# Patient Record
Sex: Female | Born: 1965 | Race: Black or African American | Hispanic: No | Marital: Single
Health system: Southern US, Community
[De-identification: ages and names within clinical notes are randomized; demographics above are authoritative.]

## PROBLEM LIST (undated history)

## (undated) HISTORY — PX: BREAST BIOPSY: SHX20

## (undated) HISTORY — PX: BREAST EXCISIONAL BIOPSY: SUR124

---

## 2015-09-25 ENCOUNTER — Other Ambulatory Visit: Payer: Self-pay | Admitting: Family

## 2015-09-25 ENCOUNTER — Ambulatory Visit
Admission: RE | Admit: 2015-09-25 | Discharge: 2015-09-25 | Disposition: A | Discharge status: Home / Self Care | Payer: Self-pay | Source: Ambulatory Visit | Attending: Family | Admitting: Family

## 2015-09-25 DIAGNOSIS — R0781 Pleurodynia: Secondary | ICD-10-CM

## 2016-06-08 ENCOUNTER — Other Ambulatory Visit: Payer: Self-pay | Admitting: Obstetrics and Gynecology

## 2016-06-08 ENCOUNTER — Other Ambulatory Visit (HOSPITAL_COMMUNITY)
Admission: RE | Admit: 2016-06-08 | Discharge: 2016-06-08 | Disposition: A | Discharge status: Home / Self Care | Payer: 59 | Source: Ambulatory Visit | Attending: Obstetrics and Gynecology | Admitting: Obstetrics and Gynecology

## 2016-06-08 DIAGNOSIS — Z1231 Encounter for screening mammogram for malignant neoplasm of breast: Secondary | ICD-10-CM

## 2016-06-08 DIAGNOSIS — Z1151 Encounter for screening for human papillomavirus (HPV): Secondary | ICD-10-CM | POA: Diagnosis not present

## 2016-06-08 DIAGNOSIS — Z01419 Encounter for gynecological examination (general) (routine) without abnormal findings: Secondary | ICD-10-CM | POA: Diagnosis present

## 2016-06-10 LAB — CYTOLOGY - PAP

## 2016-06-23 ENCOUNTER — Ambulatory Visit
Admission: RE | Admit: 2016-06-23 | Discharge: 2016-06-23 | Disposition: A | Discharge status: Home / Self Care | Payer: 59 | Source: Ambulatory Visit | Attending: Obstetrics and Gynecology | Admitting: Obstetrics and Gynecology

## 2016-06-23 DIAGNOSIS — Z1231 Encounter for screening mammogram for malignant neoplasm of breast: Secondary | ICD-10-CM

## 2016-06-24 ENCOUNTER — Other Ambulatory Visit: Payer: Self-pay | Admitting: Obstetrics and Gynecology

## 2016-06-24 DIAGNOSIS — R928 Other abnormal and inconclusive findings on diagnostic imaging of breast: Secondary | ICD-10-CM

## 2016-06-28 ENCOUNTER — Ambulatory Visit
Admission: RE | Admit: 2016-06-28 | Discharge: 2016-06-28 | Disposition: A | Discharge status: Home / Self Care | Payer: 59 | Source: Ambulatory Visit | Attending: Obstetrics and Gynecology | Admitting: Obstetrics and Gynecology

## 2016-06-28 DIAGNOSIS — R928 Other abnormal and inconclusive findings on diagnostic imaging of breast: Secondary | ICD-10-CM

## 2018-07-13 ENCOUNTER — Other Ambulatory Visit: Payer: Self-pay | Admitting: Obstetrics and Gynecology

## 2018-07-13 ENCOUNTER — Ambulatory Visit
Admission: RE | Admit: 2018-07-13 | Discharge: 2018-07-13 | Disposition: A | Discharge status: Home / Self Care | Payer: BC Managed Care – PPO | Source: Ambulatory Visit | Attending: Obstetrics and Gynecology | Admitting: Obstetrics and Gynecology

## 2018-07-13 DIAGNOSIS — Z1231 Encounter for screening mammogram for malignant neoplasm of breast: Secondary | ICD-10-CM

## 2018-11-18 ENCOUNTER — Emergency Department (HOSPITAL_COMMUNITY)
Admission: EM | Admit: 2018-11-18 | Discharge: 2018-11-18 | Disposition: A | Discharge status: Home / Self Care | Payer: BC Managed Care – PPO | Attending: Emergency Medicine | Admitting: Emergency Medicine

## 2018-11-18 ENCOUNTER — Other Ambulatory Visit: Payer: Self-pay

## 2018-11-18 ENCOUNTER — Encounter (HOSPITAL_COMMUNITY): Payer: Self-pay

## 2018-11-18 DIAGNOSIS — R4182 Altered mental status, unspecified: Secondary | ICD-10-CM | POA: Diagnosis present

## 2018-11-18 DIAGNOSIS — Z79899 Other long term (current) drug therapy: Secondary | ICD-10-CM | POA: Diagnosis not present

## 2018-11-18 DIAGNOSIS — F12921 Cannabis use, unspecified with intoxication delirium: Secondary | ICD-10-CM

## 2018-11-18 DIAGNOSIS — E876 Hypokalemia: Secondary | ICD-10-CM

## 2018-11-18 DIAGNOSIS — F1994 Other psychoactive substance use, unspecified with psychoactive substance-induced mood disorder: Secondary | ICD-10-CM | POA: Insufficient documentation

## 2018-11-18 LAB — CBC WITH DIFFERENTIAL/PLATELET
Abs Immature Granulocytes: 0.02 10*3/uL (ref 0.00–0.07)
BASOS PCT: 1 %
Basophils Absolute: 0.1 10*3/uL (ref 0.0–0.1)
EOS ABS: 0.1 10*3/uL (ref 0.0–0.5)
EOS PCT: 1 %
HCT: 35.1 % — ABNORMAL LOW (ref 36.0–46.0)
Hemoglobin: 11.6 g/dL — ABNORMAL LOW (ref 12.0–15.0)
IMMATURE GRANULOCYTES: 0 %
Lymphocytes Relative: 47 %
Lymphs Abs: 2.9 10*3/uL (ref 0.7–4.0)
MCH: 28.9 pg (ref 26.0–34.0)
MCHC: 33 g/dL (ref 30.0–36.0)
MCV: 87.3 fL (ref 80.0–100.0)
Monocytes Absolute: 0.4 10*3/uL (ref 0.1–1.0)
Monocytes Relative: 7 %
NEUTROS PCT: 44 %
NRBC: 0 % (ref 0.0–0.2)
Neutro Abs: 2.7 10*3/uL (ref 1.7–7.7)
PLATELETS: 378 10*3/uL (ref 150–400)
RBC: 4.02 MIL/uL (ref 3.87–5.11)
RDW: 12 % (ref 11.5–15.5)
WBC: 6.2 10*3/uL (ref 4.0–10.5)

## 2018-11-18 LAB — COMPREHENSIVE METABOLIC PANEL
ALT: 15 U/L (ref 0–44)
AST: 24 U/L (ref 15–41)
Albumin: 3.8 g/dL (ref 3.5–5.0)
Alkaline Phosphatase: 34 U/L — ABNORMAL LOW (ref 38–126)
Anion gap: 12 (ref 5–15)
BILIRUBIN TOTAL: 0.7 mg/dL (ref 0.3–1.2)
BUN: 13 mg/dL (ref 6–20)
CO2: 23 mmol/L (ref 22–32)
CREATININE: 1.02 mg/dL — AB (ref 0.44–1.00)
Calcium: 9.2 mg/dL (ref 8.9–10.3)
Chloride: 102 mmol/L (ref 98–111)
Glucose, Bld: 131 mg/dL — ABNORMAL HIGH (ref 70–99)
POTASSIUM: 3.3 mmol/L — AB (ref 3.5–5.1)
Sodium: 137 mmol/L (ref 135–145)
TOTAL PROTEIN: 6.8 g/dL (ref 6.5–8.1)

## 2018-11-18 LAB — RAPID URINE DRUG SCREEN, HOSP PERFORMED
AMPHETAMINES: NOT DETECTED
Amphetamines: NOT DETECTED
BARBITURATES: NOT DETECTED
BENZODIAZEPINES: NOT DETECTED
Barbiturates: NOT DETECTED
Benzodiazepines: NOT DETECTED
COCAINE: NOT DETECTED
Cocaine: NOT DETECTED
Opiates: NOT DETECTED
Opiates: NOT DETECTED
Tetrahydrocannabinol: POSITIVE — AB
Tetrahydrocannabinol: POSITIVE — AB

## 2018-11-18 LAB — CBG MONITORING, ED: Glucose-Capillary: 129 mg/dL — ABNORMAL HIGH (ref 70–99)

## 2018-11-18 LAB — ACETAMINOPHEN LEVEL: Acetaminophen (Tylenol), Serum: <10 ug/mL — ABNORMAL LOW (ref 10–30)

## 2018-11-18 LAB — POC URINE PREG, ED: Preg Test, Ur: NEGATIVE

## 2018-11-18 LAB — SALICYLATE LEVEL

## 2018-11-18 LAB — ETHANOL: Alcohol, Ethyl (B): <10 mg/dL (ref <10)

## 2018-11-18 MED ORDER — LORAZEPAM 2 MG/ML IJ SOLN
INTRAMUSCULAR | Status: AC
  Administered 2018-11-18: 1 mg via INTRAMUSCULAR
  Filled 2018-11-18: qty 1

## 2018-11-18 MED ORDER — LORAZEPAM 2 MG/ML IJ SOLN
1.0000 mg | Freq: Once | INTRAMUSCULAR | Status: AC
  Administered 2018-11-18: 1 mg via INTRAMUSCULAR

## 2018-11-18 NOTE — ED Notes (Signed)
Keys, cell phone, and insurance card at bedside with patient.

## 2018-11-18 NOTE — ED Notes (Signed)
TTS at bedside. 

## 2018-11-18 NOTE — Discharge Instructions (Addendum)
It was our pleasure to provide your ER care today - we hope that you feel better.  From today's lab tests, your potassium level is slightly low - eat plenty of fruits and vegetables, and follow up with primary care doctor.   Return to ER if worse, symptoms recur or new symptoms, fevers, trouble breathing, other concern.

## 2018-11-18 NOTE — ED Provider Notes (Signed)
Patient is alert, oriented, with normal mood/affect. No delusions, hallucinations, or psychosis. Pt is feeling back to normal, and feels ready for d/c. Pt denies any c/o. Vitals normal.   Pt currently appears stable for d/c.   Vitals:   11/18/18 0630 11/18/18 0700  BP: (!) 125/91 123/82  Pulse: 68   Resp: 15 15  Temp:    SpO2: 99% 99%      Cathren Laine, MD 11/18/18 [phone]

## 2018-11-18 NOTE — ED Triage Notes (Addendum)
Pt reports that she does not smoke weed and she tried an edible for the first time. She is unable to stay still. A&Ox4. Ambulatory. Constantly repeating herself.

## 2018-11-18 NOTE — ED Notes (Signed)
Bed: WTR6 Expected date:  Expected time:  Means of arrival:  Comments: 

## 2018-11-18 NOTE — ED Provider Notes (Addendum)
[facility]-EMERGENCY DEPT Provider Note   CSN: [id number] Arrival date & time: 11/18/18  0138     History   Chief Complaint Chief Complaint  Patient presents with  . Altered Mental Status    THC brownie    HPI Sarah Crosby is a 53 y.o. female.  The history is provided by the EMS personnel. The history is limited by the condition of the patient.  Altered Mental Status  Presenting symptoms: behavior changes and combativeness   Severity:  Severe Most recent episode:  Today Episode history:  Single Timing:  Constant Chronicity:  New Context: alcohol use   Context comment:  Rice crispie bar with THC in it Associated symptoms: agitation   Associated symptoms: no rash     History reviewed. No pertinent past medical history.  There are no active problems to display for this patient.   Past Surgical History:  Procedure Laterality Date  . BREAST BIOPSY Right    negative  . BREAST EXCISIONAL BIOPSY Right    many year ago, negative     OB History   No obstetric history on file.      Home Medications    Prior to Admission medications   Medication Sig Start Date End Date Taking? Authorizing Provider  amLODipine (NORVASC) 5 MG tablet Take 5 mg by mouth daily. 10/23/18  Yes [provider]  atorvastatin (LIPITOR) 10 MG tablet Take 10 mg by mouth daily. 10/23/18  Yes [provider]  BIOTIN PO Take 1 tablet by mouth daily.   Yes [provider]  Calcium Carbonate-Vitamin D 500-125 MG-UNIT TABS Take 1 tablet by mouth daily.   Yes [provider]  Cholecalciferol 25 MCG (1000 UT) capsule Take 1,000 Units by mouth daily.   Yes [provider]  ezetimibe (ZETIA) 10 MG tablet Take 10 mg by mouth daily. 10/23/18  Yes [provider]  MAGNESIUM PO Take 1 tablet by mouth daily.   Yes [provider]  Omega-3 1000 MG CAPS Take 1 capsule by mouth daily.   Yes [provider]     Family History Family History  Problem Relation Age of Onset  . Breast cancer Neg Hx     Social History Social History   Tobacco Use  . Smoking status: Not on file  Substance Use Topics  . Alcohol use: Not on file  . Drug use: Not on file     Allergies   Patient has no known allergies.   Review of Systems Review of Systems  Unable to perform ROS: Acuity of condition  Skin: Negative for rash.  Hematological: Negative for adenopathy.  Psychiatric/Behavioral: Positive for agitation. The patient is not nervous/anxious.      Physical Exam Updated Vital Signs BP 123/82   Pulse 68   Temp 99 F (37.2 C) (Oral)   Resp 15   SpO2 99%   Physical Exam Vitals signs and nursing note reviewed.  Constitutional:      Appearance: She is not diaphoretic.  HENT:     Head: Normocephalic and atraumatic.     Right Ear: Tympanic membrane normal.     Left Ear: Tympanic membrane normal.     Nose: Nose normal.     Mouth/Throat:     Mouth: Mucous membranes are moist.  Eyes:     Conjunctiva/sclera: Conjunctivae normal.     Pupils: Pupils are equal, round, and reactive to light.  Cardiovascular:     Pulses: Normal pulses.  Heart sounds: Normal heart sounds.  Pulmonary:     Effort: Pulmonary effort is normal.     Breath sounds: Normal breath sounds. No stridor.  Abdominal:     General: Abdomen is flat. Bowel sounds are normal.     Tenderness: There is no abdominal tenderness.  Musculoskeletal: Normal range of motion.  Skin:    General: Skin is warm and dry.     Capillary Refill: Capillary refill takes less than 2 seconds.  Neurological:     Gait: Gait normal.     Deep Tendon Reflexes:     Reflex Scores:      Tricep reflexes are 2+ on the right side and 2+ on the left side.      Bicep reflexes are 2+ on the right side and 2+ on the left side.      Brachioradialis reflexes are 2+ on the right side and 2+ on the left side.      Patellar reflexes are 2+ on the right side  and 2+ on the left side.      Achilles reflexes are 2+ on the right side and 2+ on the left side. Psychiatric:        Attention and Perception: She is inattentive.        Speech: Speech is rapid and pressured and tangential.        Behavior: Behavior is agitated and hyperactive. Behavior is not slowed, withdrawn or combative.     Comments: Repetitive  psychosis      Sarah Treatments / Results  Labs (all labs ordered are listed, but only abnormal results are displayed) Results for orders placed or performed during the hospital encounter of 11/18/18  Rapid urine drug screen (hospital performed)  Result Value Ref Range   Opiates NONE DETECTED NONE DETECTED   Cocaine NONE DETECTED NONE DETECTED   Benzodiazepines NONE DETECTED NONE DETECTED   Amphetamines NONE DETECTED NONE DETECTED   Tetrahydrocannabinol POSITIVE (A) NONE DETECTED   Barbiturates NONE DETECTED NONE DETECTED  Comprehensive metabolic panel  Result Value Ref Range   Sodium 137 135 - 145 mmol/L   Potassium 3.3 (L) 3.5 - 5.1 mmol/L   Chloride 102 98 - 111 mmol/L   CO2 23 22 - 32 mmol/L   Glucose, Bld 131 (H) 70 - 99 mg/dL   BUN 13 6 - 20 mg/dL   Creatinine, Ser 5.39 (H) 0.44 - 1.00 mg/dL   Calcium 9.2 8.9 - 76.7 mg/dL   Total Protein 6.8 6.5 - 8.1 g/dL   Albumin 3.8 3.5 - 5.0 g/dL   AST 24 15 - 41 U/L   ALT 15 0 - 44 U/L   Alkaline Phosphatase 34 (L) 38 - 126 U/L   Total Bilirubin 0.7 0.3 - 1.2 mg/dL   GFR calc non Af Amer >60 >60 mL/min   GFR calc Af Amer >60 >60 mL/min   Anion gap 12 5 - 15  Salicylate level  Result Value Ref Range   Salicylate Lvl <7.0 2.8 - 30.0 mg/dL  Acetaminophen level  Result Value Ref Range   Acetaminophen (Tylenol), Serum <10 (L) 10 - 30 ug/mL  Ethanol  Result Value Ref Range   Alcohol, Ethyl (B) <10 <10 mg/dL  Urine rapid drug screen (hosp performed)  Result Value Ref Range   Opiates NONE DETECTED NONE DETECTED   Cocaine NONE DETECTED NONE DETECTED   Benzodiazepines NONE DETECTED  NONE DETECTED   Amphetamines NONE DETECTED NONE DETECTED   Tetrahydrocannabinol POSITIVE (A) NONE DETECTED  Barbiturates NONE DETECTED NONE DETECTED  CBC WITH DIFFERENTIAL  Result Value Ref Range   WBC 6.2 4.0 - 10.5 K/uL   RBC 4.02 3.87 - 5.11 MIL/uL   Hemoglobin 11.6 (L) 12.0 - 15.0 g/dL   HCT 16.135.1 (L) 09.636.0 - 04.546.0 %   MCV 87.3 80.0 - 100.0 fL   MCH 28.9 26.0 - 34.0 pg   MCHC 33.0 30.0 - 36.0 g/dL   RDW 40.912.0 81.111.5 - 91.415.5 %   Platelets 378 150 - 400 K/uL   nRBC 0.0 0.0 - 0.2 %   Neutrophils Relative % 44 %   Neutro Abs 2.7 1.7 - 7.7 K/uL   Lymphocytes Relative 47 %   Lymphs Abs 2.9 0.7 - 4.0 K/uL   Monocytes Relative 7 %   Monocytes Absolute 0.4 0.1 - 1.0 K/uL   Eosinophils Relative 1 %   Eosinophils Absolute 0.1 0.0 - 0.5 K/uL   Basophils Relative 1 %   Basophils Absolute 0.1 0.0 - 0.1 K/uL   Immature Granulocytes 0 %   Abs Immature Granulocytes 0.02 0.00 - 0.07 K/uL  POC Urine Pregnancy, Sarah ([facility])  Result Value Ref Range   Preg Test, Ur NEGATIVE NEGATIVE  CBG monitoring, Sarah  Result Value Ref Range   Glucose-Capillary 129 (H) 70 - 99 mg/dL   No results found.  EKG EKG Interpretation  Date/Time:  Saturday November 18 2018 02:09:55 EST Ventricular Rate:  69 PR Interval:    QRS Duration: 93 QT Interval:  424 QTC Calculation: 455 R Axis:   61 Text Interpretation:  Sinus rhythm Confirmed by Nicanor AlconPalumbo, Bellany Elbaum ([id number]) on 11/18/2018 3:32:57 AM   Radiology No results found.  Procedures Procedures (including critical care time)  Medications Ordered in Sarah Medications  LORazepam (ATIVAN) injection 1 mg (1 mg Intramuscular Given 11/18/18 0209)  on arrival for agitation and psychosis.      Following IVF and ativan patient slept for several hours and awoke and was alert and oriented.  States she drank several lemon drop martinis and then ate a rice crispy bar with THC.    605 am EDP politely explained given her degree of psychosis on arrival she would need  to be seen by psychiatry and patient is agreeable to this plan.  EDP also stated that given patient's adverse reaction to her ingestion she should avoid any further THC.    720 am EDP also informed patient of prolonged QT on EKG and need for close follow up with her PMD for ongoing testing.  Patient verbalizes understanding and agrees to follow up   Final Clinical Impressions(s) / Sarah Diagnoses   Resting comfortably.  Signed out to AM attending pending psychiatry evaluation, seen by TTS.             Samil Mecham, MD 11/18/18 [id number]

## 2019-07-17 ENCOUNTER — Other Ambulatory Visit: Payer: Self-pay | Admitting: Obstetrics and Gynecology

## 2019-07-17 ENCOUNTER — Other Ambulatory Visit (HOSPITAL_COMMUNITY)
Admission: RE | Admit: 2019-07-17 | Discharge: 2019-07-17 | Disposition: A | Discharge status: Home / Self Care | Payer: BC Managed Care – PPO | Source: Ambulatory Visit | Attending: Obstetrics and Gynecology | Admitting: Obstetrics and Gynecology

## 2019-07-17 DIAGNOSIS — Z01419 Encounter for gynecological examination (general) (routine) without abnormal findings: Secondary | ICD-10-CM | POA: Insufficient documentation

## 2019-07-17 DIAGNOSIS — Z1231 Encounter for screening mammogram for malignant neoplasm of breast: Secondary | ICD-10-CM

## 2019-07-18 ENCOUNTER — Other Ambulatory Visit: Payer: Self-pay

## 2019-07-18 ENCOUNTER — Ambulatory Visit
Admission: RE | Admit: 2019-07-18 | Discharge: 2019-07-18 | Disposition: A | Discharge status: Home / Self Care | Payer: BC Managed Care – PPO | Source: Ambulatory Visit | Attending: Obstetrics and Gynecology | Admitting: Obstetrics and Gynecology

## 2019-07-18 DIAGNOSIS — Z1231 Encounter for screening mammogram for malignant neoplasm of breast: Secondary | ICD-10-CM

## 2019-07-20 LAB — CYTOLOGY - PAP
Adequacy: ABSENT
Diagnosis: NEGATIVE
HPV 16/18/45 genotyping: NEGATIVE
HPV: DETECTED — AB

## 2020-01-17 ENCOUNTER — Ambulatory Visit: Payer: BC Managed Care – PPO | Attending: Family

## 2020-01-17 DIAGNOSIS — Z23 Encounter for immunization: Secondary | ICD-10-CM

## 2020-01-17 NOTE — Progress Notes (Signed)
   Covid-19 Vaccination Clinic  Name:  MADALENE MICKLER    MRN: [id number] DOB: 06/13/1966  01/17/2020  Ms. Dayley was observed post Covid-19 immunization for 15 minutes without incident. She was provided with Vaccine Information Sheet and instruction to access the V-Safe system.   Ms. Darnold was instructed to call 911 with any severe reactions post vaccine: Marland Kitchen Difficulty breathing  . Swelling of face and throat  . A fast heartbeat  . A bad rash all over body  . Dizziness and weakness   Immunizations Administered    Name Date Dose VIS Date Route   Moderna COVID-19 Vaccine 01/17/2020  4:49 PM 0.5 mL 10/09/2019 Intramuscular   Manufacturer: Moderna   Lot: [id number]   NDC: 55374-827-07

## 2020-02-14 ENCOUNTER — Ambulatory Visit: Payer: BC Managed Care – PPO | Attending: Family

## 2020-02-14 DIAGNOSIS — Z23 Encounter for immunization: Secondary | ICD-10-CM

## 2020-02-14 NOTE — Progress Notes (Signed)
   Covid-19 Vaccination Clinic  Name:  JODI CRISCUOLO    MRN: [id number] DOB: [birth year]  02/14/2020  Ms. Hiraldo was observed post Covid-19 immunization for 15 minutes without incident. She was provided with Vaccine Information Sheet and instruction to access the V-Safe system.   Ms. Schoonmaker was instructed to call 911 with any severe reactions post vaccine: Marland Kitchen Difficulty breathing  . Swelling of face and throat  . A fast heartbeat  . A bad rash all over body  . Dizziness and weakness   Immunizations Administered    Name Date Dose VIS Date Route   Moderna COVID-19 Vaccine 02/14/2020  1:36 PM 0.5 mL 10/09/2019 Intramuscular   Manufacturer: Moderna   Lot: [id number]   NDC: 31250-871-99

## 2020-02-19 ENCOUNTER — Ambulatory Visit: Payer: BC Managed Care – PPO

## 2020-06-24 ENCOUNTER — Other Ambulatory Visit: Payer: Self-pay | Admitting: Family Medicine

## 2020-06-24 DIAGNOSIS — Z1231 Encounter for screening mammogram for malignant neoplasm of breast: Secondary | ICD-10-CM

## 2020-07-18 ENCOUNTER — Other Ambulatory Visit: Payer: Self-pay

## 2020-07-18 ENCOUNTER — Other Ambulatory Visit: Payer: BC Managed Care – PPO

## 2020-07-18 ENCOUNTER — Ambulatory Visit
Admission: RE | Admit: 2020-07-18 | Discharge: 2020-07-18 | Disposition: A | Discharge status: Home / Self Care | Payer: BC Managed Care – PPO | Source: Ambulatory Visit

## 2020-07-18 DIAGNOSIS — Z1231 Encounter for screening mammogram for malignant neoplasm of breast: Secondary | ICD-10-CM

## 2020-07-18 DIAGNOSIS — Z20822 Contact with and (suspected) exposure to covid-19: Secondary | ICD-10-CM

## 2020-07-21 LAB — NOVEL CORONAVIRUS, NAA: SARS-CoV-2, NAA: NOT DETECTED

## 2020-09-18 ENCOUNTER — Ambulatory Visit: Payer: BC Managed Care – PPO | Attending: Family

## 2020-09-18 DIAGNOSIS — Z23 Encounter for immunization: Secondary | ICD-10-CM

## 2020-12-17 NOTE — Progress Notes (Signed)
   Covid-19 Vaccination Clinic  Name:  Sarah Crosby    MRN: [id number] DOB: 1966/01/23  12/17/2020  [name] was observed post Covid-19 immunization for 15 minutes without incident. She was provided with Vaccine Information Sheet and instruction to access the V-Safe system.   [name] was instructed to call 911 with any severe reactions post vaccine: Marland Kitchen Difficulty breathing  . Swelling of face and throat  . A fast heartbeat  . A bad rash all over body  . Dizziness and weakness   Immunizations Administered    Name Date Dose VIS Date Route   Moderna Covid-19 Booster Vaccine 09/18/2020  1:30 PM 0.25 mL 08/27/2020 Intramuscular   Manufacturer: Moderna   Lot: [id number]   NDC: 17471-595-39

## 2021-05-01 ENCOUNTER — Ambulatory Visit: Payer: Self-pay | Attending: Family

## 2021-05-01 DIAGNOSIS — Z23 Encounter for immunization: Secondary | ICD-10-CM

## 2021-05-01 NOTE — Progress Notes (Signed)
   Covid-19 Vaccination Clinic  Name:  Sarah Crosby    MRN: [id number] DOB: 11-09-1965  05/01/2021  [name] was observed post Covid-19 immunization for 15 minutes without incident. She was provided with Vaccine Information Sheet and instruction to access the V-Safe system.   [name] was instructed to call 911 with any severe reactions post vaccine: Difficulty breathing  Swelling of face and throat  A fast heartbeat  A bad rash all over body  Dizziness and weakness   Immunizations Administered     Name Date Dose VIS Date Route   Moderna Covid-19 Booster Vaccine 05/01/2021  4:09 PM 0.25 mL 08/27/2020 Intramuscular   Manufacturer: Moderna   Lot: [id number]   NDC: 84037-543-60

## 2021-06-24 ENCOUNTER — Other Ambulatory Visit: Payer: Self-pay | Admitting: Family Medicine

## 2021-06-24 DIAGNOSIS — Z1231 Encounter for screening mammogram for malignant neoplasm of breast: Secondary | ICD-10-CM

## 2021-08-10 ENCOUNTER — Ambulatory Visit: Payer: Self-pay

## 2021-08-27 ENCOUNTER — Ambulatory Visit
Admission: RE | Admit: 2021-08-27 | Discharge: 2021-08-27 | Disposition: A | Discharge status: Home / Self Care | Payer: BC Managed Care – PPO | Source: Ambulatory Visit | Attending: Family Medicine | Admitting: Family Medicine

## 2021-08-27 ENCOUNTER — Other Ambulatory Visit: Payer: Self-pay

## 2021-08-27 DIAGNOSIS — Z1231 Encounter for screening mammogram for malignant neoplasm of breast: Secondary | ICD-10-CM

## 2021-10-30 IMAGING — MG MM DIGITAL SCREENING BILAT W/ TOMO AND CAD
8 series · 9 of 24 positions shown · non-contrast
Comparison: Previous exam(s).

CLINICAL DATA: Screening.

EXAM:
DIGITAL SCREENING BILATERAL MAMMOGRAM WITH TOMOSYNTHESIS AND CAD
TECHNIQUE: Bilateral screening digital craniocaudal and mediolateral oblique
mammograms were obtained. Bilateral screening digital breast
tomosynthesis was performed. The images were evaluated with
computer-aided detection.

[L MLO synth-2D]
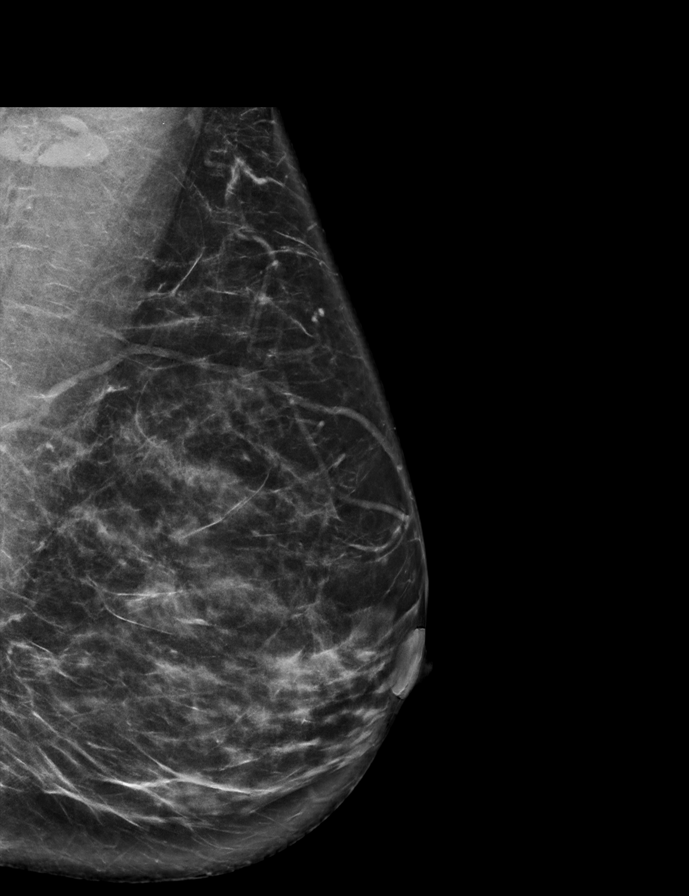

[R MLO synth-2D]
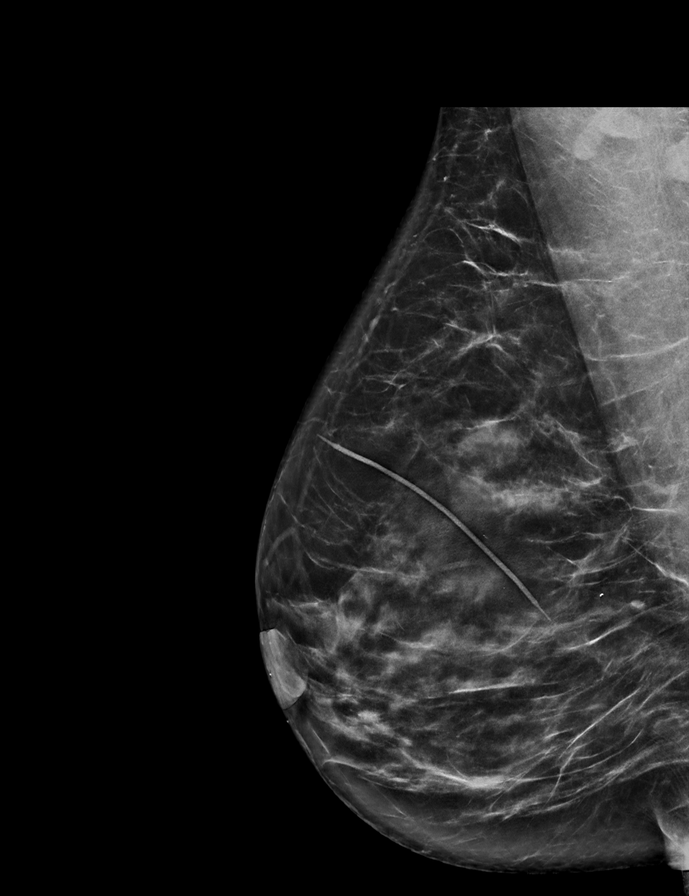

[L CC synth-2D]
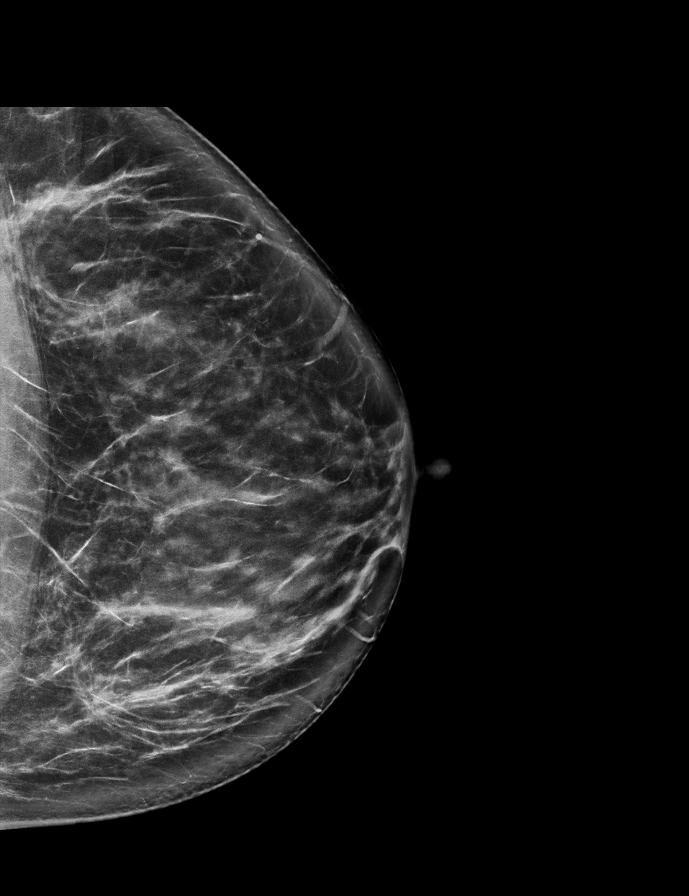

[R CC synth-2D]
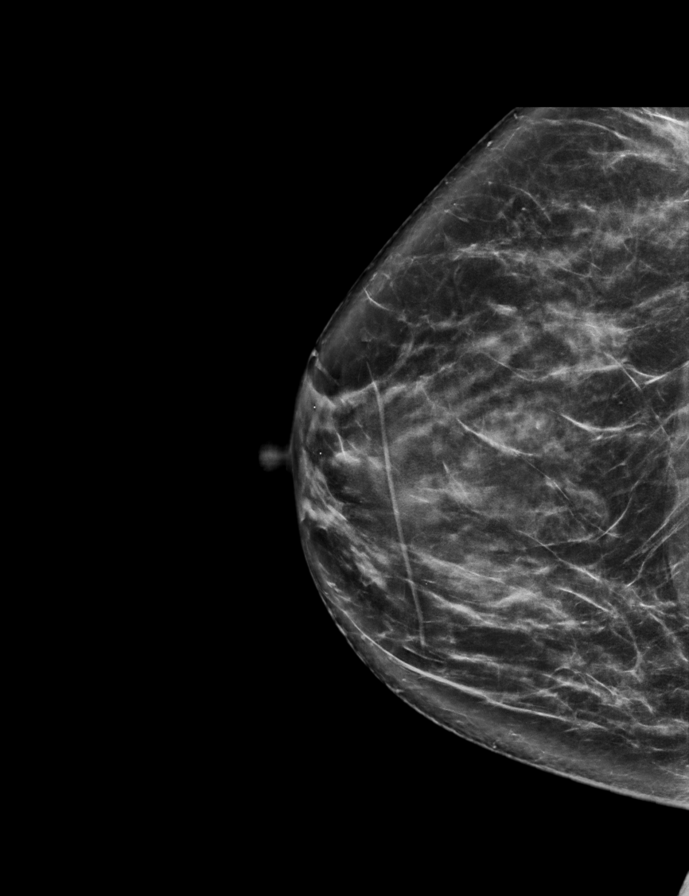

[R MLO tomo · 2 of 76 frames shown]
[frame 25/76]
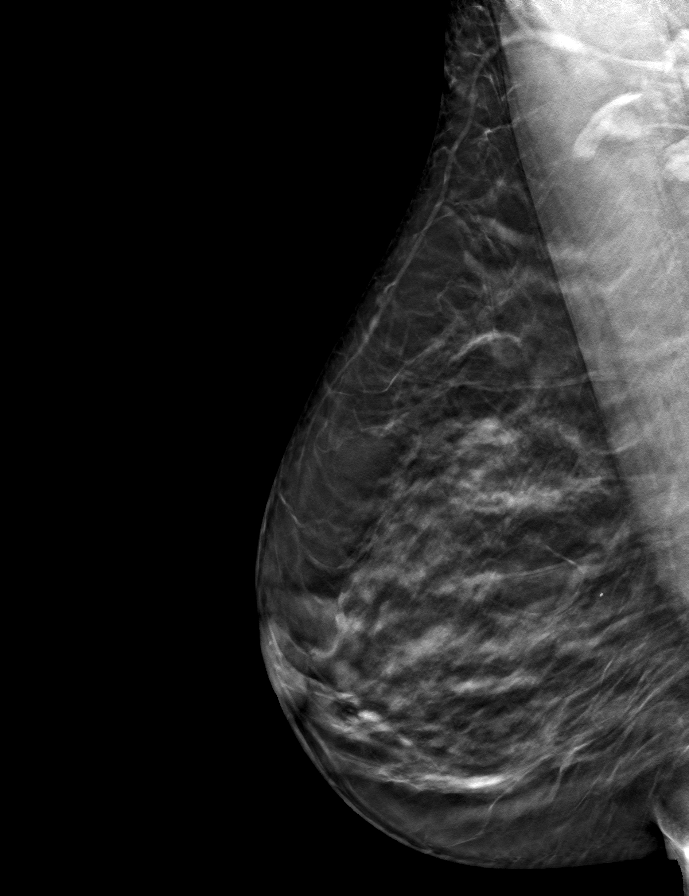
[frame 39/76]
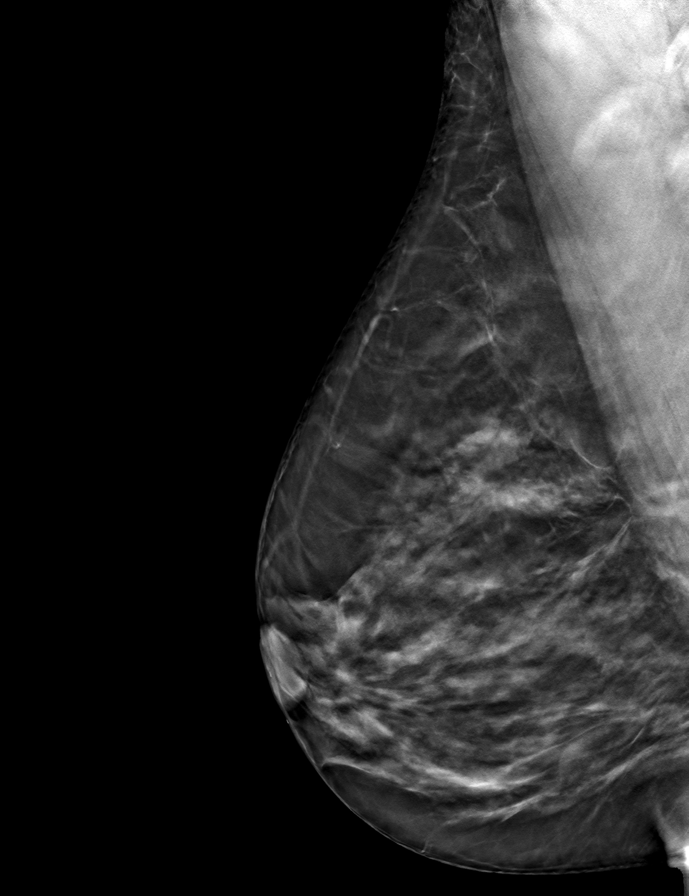

[L MLO tomo · tomo slice 40/79.0]
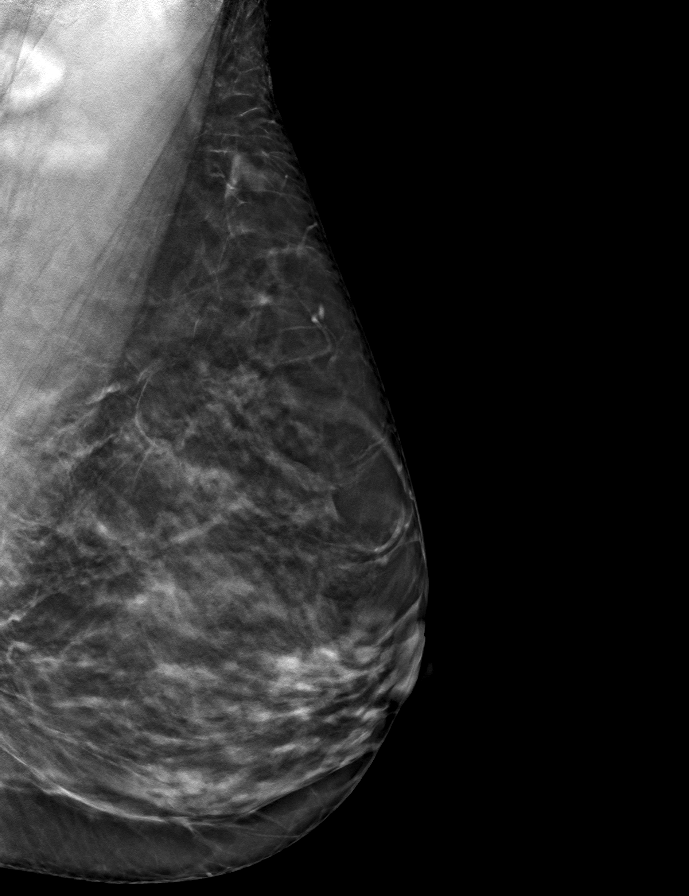

[R CC tomo · tomo slice 37/74.0]
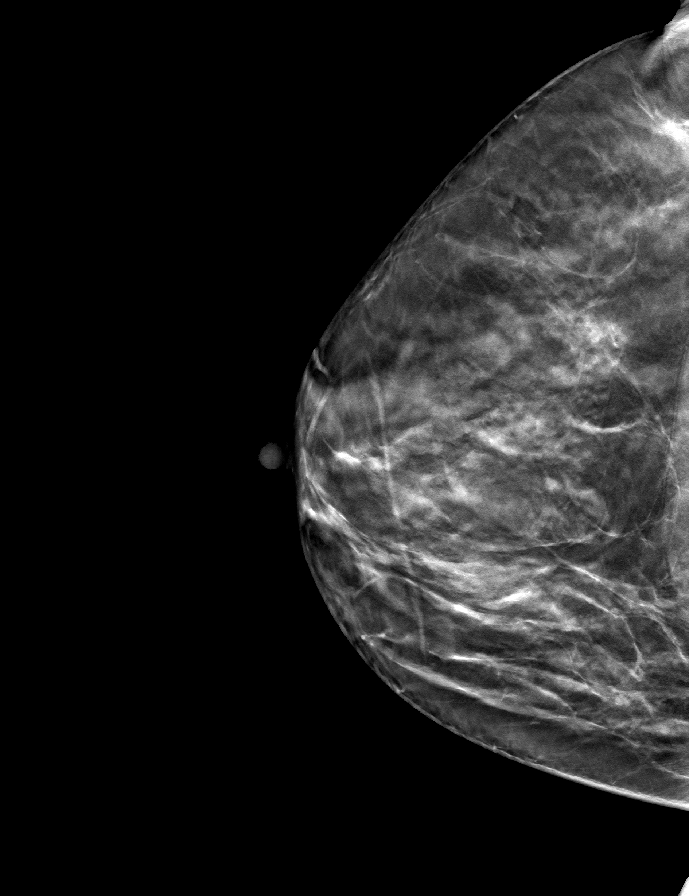

[L CC tomo · tomo slice 41/82.0]
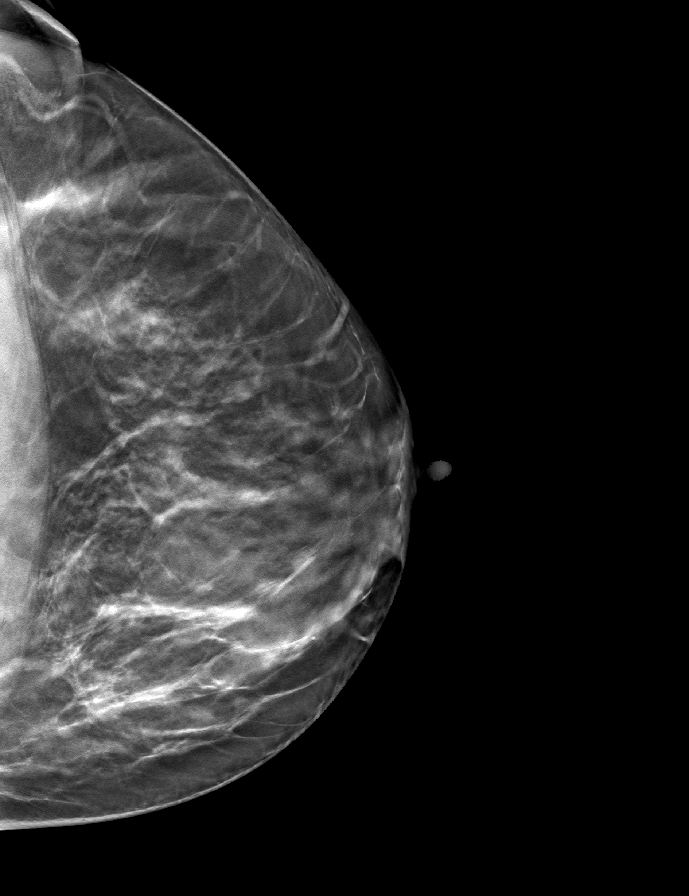

[9 of 24 positions shown; findings below may reference images not displayed]

ACR Breast Density Category c: The breast tissue is heterogeneously
dense, which may obscure small masses.
FINDINGS: There are no findings suspicious for malignancy.
IMPRESSION: No mammographic evidence of malignancy. A result letter of this
screening mammogram will be mailed directly to the patient.

RECOMMENDATION:
Screening mammogram in one year. (Code:Q3-W-BC3)

BI-RADS CATEGORY  1: Negative.
